# Patient Record
Sex: Male | Born: 1995 | Race: Black or African American | Hispanic: No | Marital: Single | Smoking: Never smoker
Health system: Southern US, Community
[De-identification: ages and names within clinical notes are randomized; demographics above are authoritative.]

## PROBLEM LIST (undated history)

## (undated) HISTORY — PX: TESTICLE SURGERY: SHX794

---

## 2013-04-17 ENCOUNTER — Emergency Department (HOSPITAL_COMMUNITY): Payer: No Typology Code available for payment source

## 2013-04-17 ENCOUNTER — Emergency Department (HOSPITAL_COMMUNITY)
Admission: EM | Admit: 2013-04-17 | Discharge: 2013-04-17 | Disposition: A | Discharge status: Home / Self Care | Payer: No Typology Code available for payment source | Attending: Emergency Medicine | Admitting: Emergency Medicine

## 2013-04-17 ENCOUNTER — Encounter (HOSPITAL_COMMUNITY): Payer: Self-pay | Admitting: Adult Health

## 2013-04-17 DIAGNOSIS — S0990XA Unspecified injury of head, initial encounter: Secondary | ICD-10-CM | POA: Insufficient documentation

## 2013-04-17 DIAGNOSIS — Y92410 Unspecified street and highway as the place of occurrence of the external cause: Secondary | ICD-10-CM | POA: Insufficient documentation

## 2013-04-17 DIAGNOSIS — Y9389 Activity, other specified: Secondary | ICD-10-CM | POA: Insufficient documentation

## 2013-04-17 DIAGNOSIS — R110 Nausea: Secondary | ICD-10-CM | POA: Insufficient documentation

## 2013-04-17 DIAGNOSIS — R42 Dizziness and giddiness: Secondary | ICD-10-CM | POA: Insufficient documentation

## 2013-04-17 MED ORDER — IBUPROFEN 800 MG PO TABS: 800.0000 mg | ORAL_TABLET | Freq: Three times a day (TID) | ORAL | Status: AC
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
## 2013-04-30 ENCOUNTER — Ambulatory Visit: Payer: Self-pay
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
## 2015-03-01 ENCOUNTER — Emergency Department (HOSPITAL_COMMUNITY): Payer: BLUE CROSS/BLUE SHIELD

## 2015-03-01 ENCOUNTER — Emergency Department (HOSPITAL_COMMUNITY)
Admission: EM | Admit: 2015-03-01 | Discharge: 2015-03-01 | Disposition: A | Discharge status: Home / Self Care | Payer: BLUE CROSS/BLUE SHIELD | Attending: Emergency Medicine | Admitting: Emergency Medicine

## 2015-03-01 ENCOUNTER — Encounter (HOSPITAL_COMMUNITY): Payer: Self-pay | Admitting: Family Medicine

## 2015-03-01 DIAGNOSIS — Z791 Long term (current) use of non-steroidal anti-inflammatories (NSAID): Secondary | ICD-10-CM | POA: Insufficient documentation

## 2015-03-01 DIAGNOSIS — N451 Epididymitis: Secondary | ICD-10-CM | POA: Diagnosis not present

## 2015-03-01 DIAGNOSIS — N508 Other specified disorders of male genital organs: Secondary | ICD-10-CM | POA: Diagnosis present

## 2015-03-01 DIAGNOSIS — N50819 Testicular pain, unspecified: Secondary | ICD-10-CM

## 2015-03-01 MED ORDER — DOXYCYCLINE HYCLATE 100 MG PO CAPS: 100.0000 mg | ORAL_CAPSULE | Freq: Two times a day (BID) | ORAL | Status: AC

## 2015-03-01 MED ORDER — DOXYCYCLINE HYCLATE 100 MG PO TABS
100.0000 mg | ORAL_TABLET | Freq: Once | ORAL | Status: AC
  Administered 2015-03-01: 100 mg via ORAL
  Filled 2015-03-01: qty 1

## 2015-03-01 MED ORDER — STERILE WATER FOR INJECTION IJ SOLN
INTRAMUSCULAR | Status: AC
  Filled 2015-03-01: qty 10

## 2015-03-01 MED ORDER — CEFTRIAXONE SODIUM 1 G IJ SOLR
1.0000 g | Freq: Once | INTRAMUSCULAR | Status: AC
  Administered 2015-03-01: 1 g via INTRAMUSCULAR
  Filled 2015-03-01: qty 10

## 2015-03-01 MED ORDER — HYDROCODONE-ACETAMINOPHEN 5-325 MG PO TABS
1.0000 | ORAL_TABLET | Freq: Once | ORAL | Status: AC
  Administered 2015-03-01: 1 via ORAL
  Filled 2015-03-01: qty 1

## 2015-03-01 NOTE — ED Notes (Addendum)
Pt states L sided testicular pain and swelling. Onset this morning. 5/10 pain. Denies abdominal pain. Denies urinary symptoms. Denies penile discharge.

## 2015-03-01 NOTE — ED Notes (Signed)
Pt sts he woke up this am with left testicle pain and slight swelling.

## 2015-03-01 NOTE — Discharge Instructions (Signed)
Epididymitis °Epididymitis is a swelling (inflammation) of the epididymis. The epididymis is a cord-like structure along the back part of the testicle. Epididymitis is usually, but not always, caused by infection. This is usually a sudden problem beginning with chills, fever and pain behind the scrotum and in the testicle. There may be swelling and redness of the testicle. °DIAGNOSIS  °Physical examination will reveal a tender, swollen epididymis. Sometimes, cultures are obtained from the urine or from prostate secretions to help find out if there is an infection or if the cause is a different problem. Sometimes, blood work is performed to see if your white blood cell count is elevated and if a germ (bacterial) or viral infection is present. Using this knowledge, an appropriate medicine which kills germs (antibiotic) can be chosen by your caregiver. A viral infection causing epididymitis will most often go away (resolve) without treatment. °HOME CARE INSTRUCTIONS  °· Hot sitz baths for 20 minutes, 4 times per day, may help relieve pain. °· Only take over-the-counter or prescription medicines for pain, discomfort or fever as directed by your caregiver. °· Take all medicines, including antibiotics, as directed. Take the antibiotics for the full prescribed length of time even if you are feeling better. °· It is very important to keep all follow-up appointments. °SEEK IMMEDIATE MEDICAL CARE IF:  °· You have a fever. °· You have pain not relieved with medicines. °· You have any worsening of your problems. °· Your pain seems to come and go. °· You develop pain, redness, and swelling in the scrotum and surrounding areas. °MAKE SURE YOU:  °· Understand these instructions. °· Will watch your condition. °· Will get help right away if you are not doing well or get worse. °Document Released: 07/21/2000 Document Revised: 10/16/2011 Document Reviewed: 06/10/2009 °[employer]® Patient Information ©2015 [employer]. This information  is not intended to replace advice given to you by your health care provider. Make sure you discuss any questions you have with your health care provider. ° °

## 2015-03-01 NOTE — ED Provider Notes (Signed)
CSN: [id number]     Arrival date & time 03/01/15  0714 History   First MD Initiated Contact with Patient 03/01/15 [phone]     Chief Complaint  Patient presents with  . Testicle Pain      HPI  Patient presents for valuation of left-sided testicular pain. He states he awakened this morning. Between 6 and 6:30 he was still laying in bed in a developed left testicle pain. Describes it as severe. Pain with movement. No dysuria no source bumps lesions. Feels like is less testicle is swollen. No discharge. No history of STDs. No history of testicular abnormalities.  History reviewed. No pertinent past medical history. History reviewed. No pertinent past surgical history. History reviewed. No pertinent family history. History  Substance Use Topics  . Smoking status: Never Smoker   . Smokeless tobacco: Not on file  . Alcohol Use: No    Review of Systems  Constitutional: Negative for fever, chills, diaphoresis, appetite change and fatigue.  HENT: Negative for mouth sores, sore throat and trouble swallowing.   Eyes: Negative for visual disturbance.  Respiratory: Negative for cough, chest tightness, shortness of breath and wheezing.   Cardiovascular: Negative for chest pain.  Gastrointestinal: Negative for nausea, vomiting, abdominal pain, diarrhea and abdominal distention.  Endocrine: Negative for polydipsia, polyphagia and polyuria.  Genitourinary: Positive for testicular pain. Negative for dysuria, frequency and hematuria.  Musculoskeletal: Negative for gait problem.  Skin: Negative for color change, pallor and rash.  Neurological: Negative for dizziness, syncope, light-headedness and headaches.  Hematological: Does not bruise/bleed easily.  Psychiatric/Behavioral: Negative for behavioral problems and confusion.      Allergies  Review of patient's allergies indicates no known allergies.  Home Medications   Prior to Admission medications   Medication Sig Start Date End Date Taking?  Authorizing Provider  ibuprofen (ADVIL,MOTRIN) 800 MG tablet Take 1 tablet (800 mg total) by mouth 3 (three) times daily. 04/17/13   Glynn Octave, MD   BP 122/70 mmHg  Pulse 56  Temp(Src) 98.8 F (37.1 C) (Oral)  Resp 20  Wt 190 lb (86.183 kg)  SpO2 98% Physical Exam  Constitutional: He is oriented to person, place, and time. He appears well-developed and well-nourished. No distress.  HENT:  Head: Normocephalic.  Eyes: Conjunctivae are normal. Pupils are equal, round, and reactive to light. No scleral icterus.  Neck: Normal range of motion. Neck supple. No thyromegaly present.  Cardiovascular: Normal rate and regular rhythm.  Exam reveals no gallop and no friction rub.   No murmur heard. Pulmonary/Chest: Effort normal and breath sounds normal. No respiratory distress. He has no wheezes. He has no rales.  Abdominal: Soft. Bowel sounds are normal. He exhibits no distension. There is no tenderness. There is no rebound.  Genitourinary:     Musculoskeletal: Normal range of motion.  Neurological: He is alert and oriented to person, place, and time.  Skin: Skin is warm and dry. No rash noted.  Psychiatric: He has a normal mood and affect. His behavior is normal.    ED Course  Procedures (including critical care time) Labs Review Labs Reviewed  URINALYSIS, ROUTINE W REFLEX MICROSCOPIC (NOT AT [facility])    Imaging Review US Scrotum  03/01/2015   CLINICAL DATA:  Onset of left-sided testicular pain and swelling this morning  EXAM: ULTRASOUND OF SCROTUM  TECHNIQUE: Complete ultrasound examination of the testicles, epididymis, and other scrotal structures was performed.  COMPARISON:  None.  FINDINGS: Right testicle  Measurements: 4.8 x 2.1 x 3 cm.  No mass or microlithiasis visualized.  Left testicle  Measurements: 4.5 x 2.2 x 2.8 cm. No mass or microlithiasis visualized.  Right epididymis: Normal in size and appearance. It measures 9.1 x 12.6 x 7.3 mm. Vascularity is normal.  Left epididymis:  The left epididymis is mildly enlarged and hyperemic. It measures 13.2 x 15.1 by 23.9 mm.  Hydrocele:  There small bilateral hydroceles.  Varicocele:  None visualized.  IMPRESSION: 1. Findings consistent with acute left epididymitis. 2. The testes are normal in vascularity and echotexture. 3. There small bilateral hydroceles.   Electronically Signed   By: David  [place] M.D.   On: 03/01/2015 08:45   [place] Art/ven Flow Abd Pelv Doppler  03/01/2015   CLINICAL DATA:  Onset of left-sided testicular pain and swelling this morning  EXAM: ULTRASOUND OF SCROTUM  TECHNIQUE: Complete ultrasound examination of the testicles, epididymis, and other scrotal structures was performed.  COMPARISON:  None.  FINDINGS: Right testicle  Measurements: 4.8 x 2.1 x 3 cm. No mass or microlithiasis visualized.  Left testicle  Measurements: 4.5 x 2.2 x 2.8 cm. No mass or microlithiasis visualized.  Right epididymis: Normal in size and appearance. It measures 9.1 x 12.6 x 7.3 mm. Vascularity is normal.  Left epididymis: The left epididymis is mildly enlarged and hyperemic. It measures 13.2 x 15.1 by 23.9 mm.  Hydrocele:  There small bilateral hydroceles.  Varicocele:  None visualized.  IMPRESSION: 1. Findings consistent with acute left epididymitis. 2. The testes are normal in vascularity and echotexture. 3. There small bilateral hydroceles.   Electronically Signed   By: David  [place] M.D.   On: 03/01/2015 08:45     EKG Interpretation None      MDM   Final diagnoses:  Testicle pain    No torsion on ultrasound. This shows epididymitis. Plan will be IV Rocephin here doxycycline.    Rolland Porter, MD 03/01/15 [phone]

## 2016-02-29 IMAGING — US US SCROTUM
1 series · 14 of 25 positions shown · non-contrast
Comparison: None.

CLINICAL DATA: Onset of left-sided testicular pain and swelling
this morning

EXAM:
ULTRASOUND OF SCROTUM
TECHNIQUE: Complete ultrasound examination of the testicles, epididymis, and
other scrotal structures was performed.

[Series 1: us scrotum · 0.06mm/px · 14 of 52 slices shown]
[im 1/52]
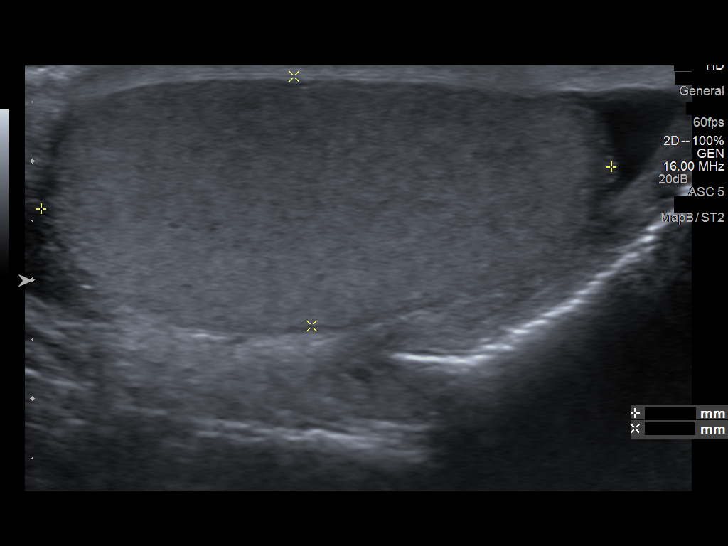
[im 5/52]
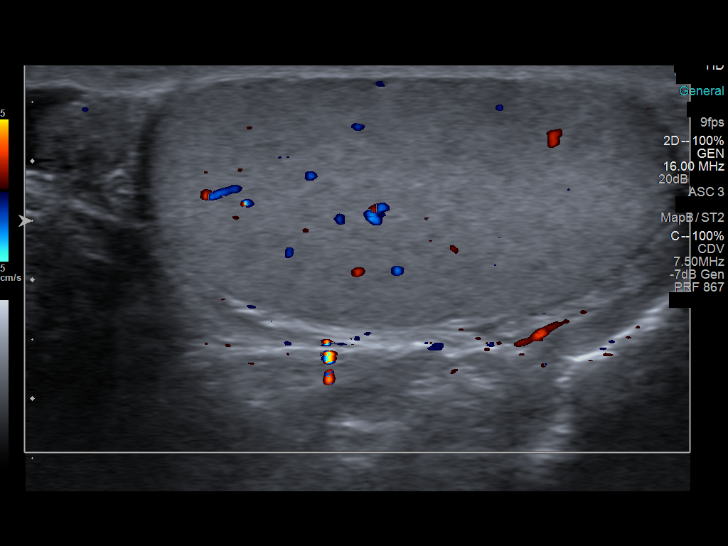
[im 9/52]
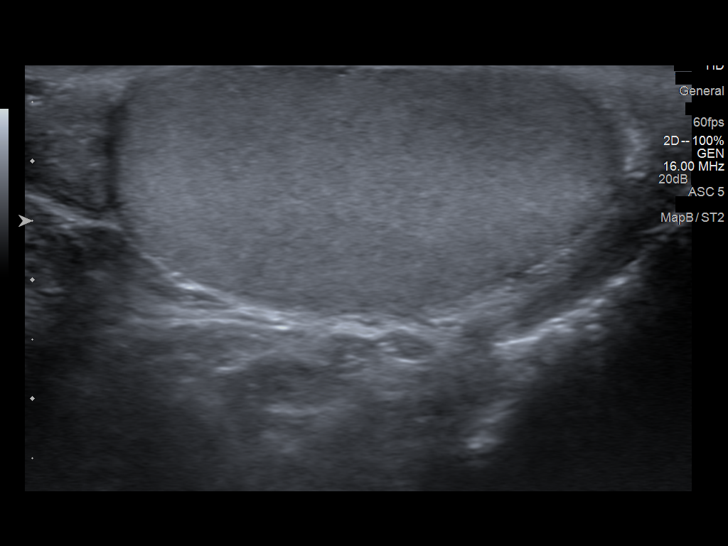
[im 13/52]
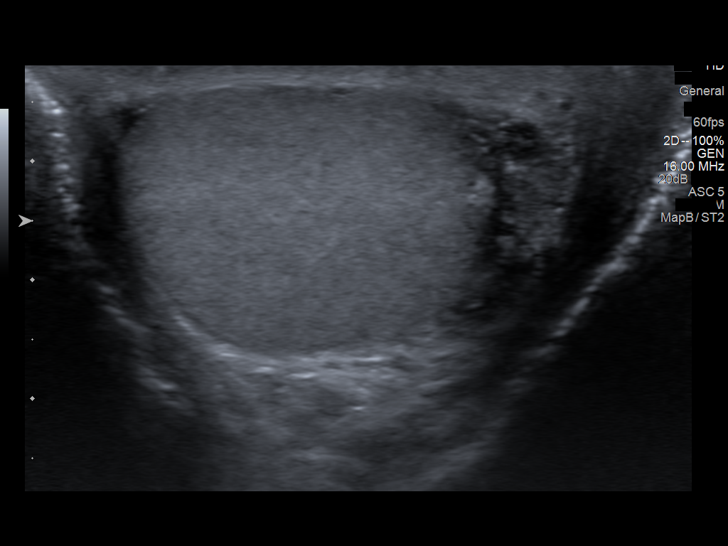
[im 18/52]
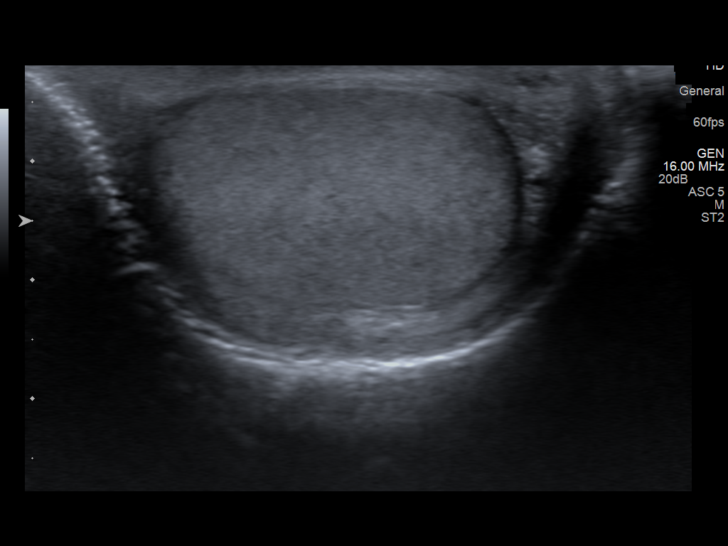
[im 20/52]
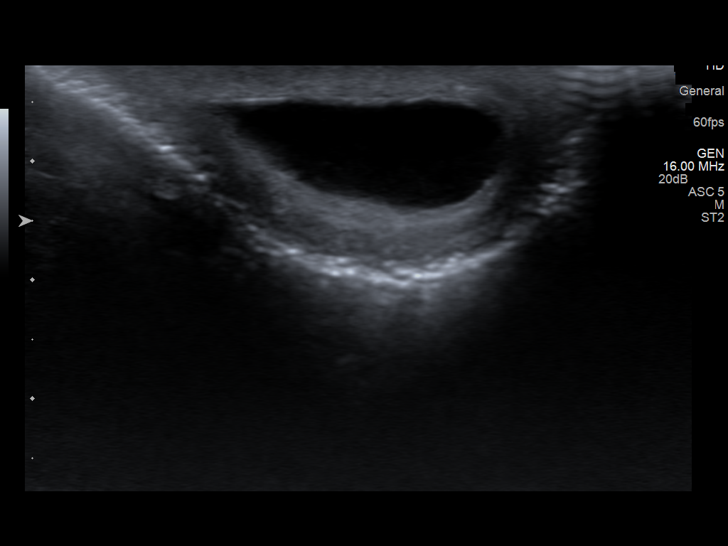
[im 24/52]
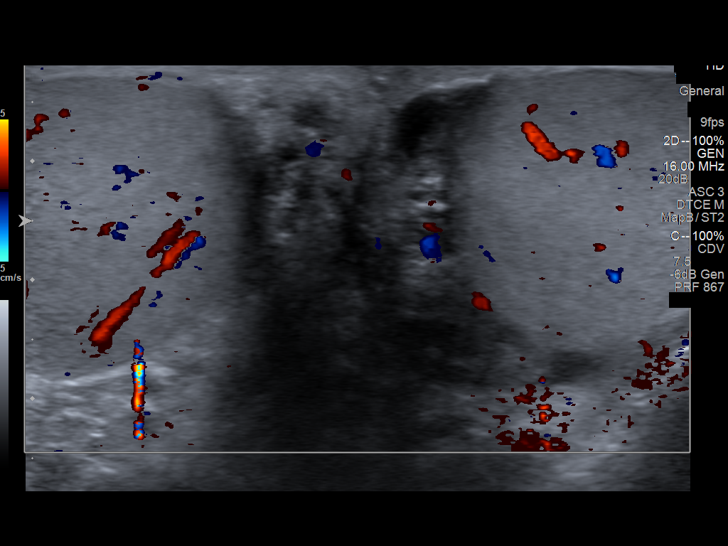
[im 28/52]
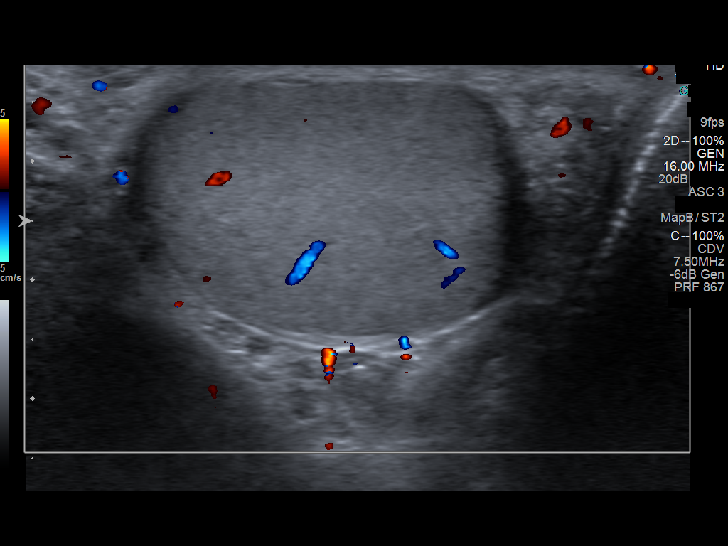
[im 32/52]
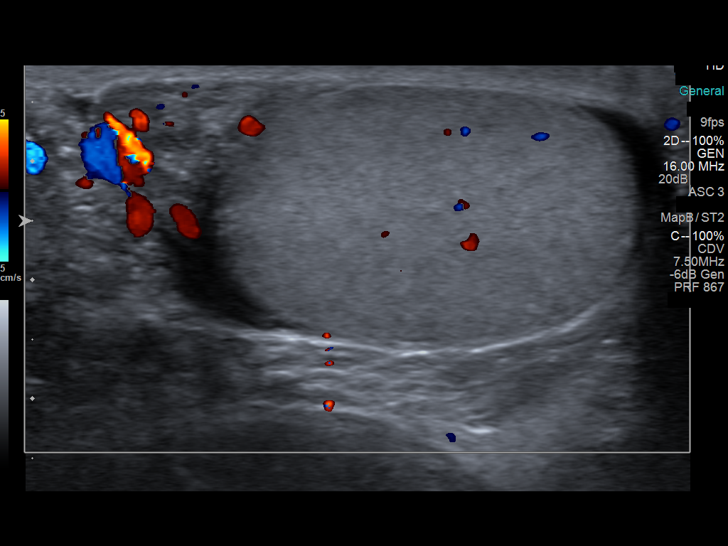
[im 35/52]
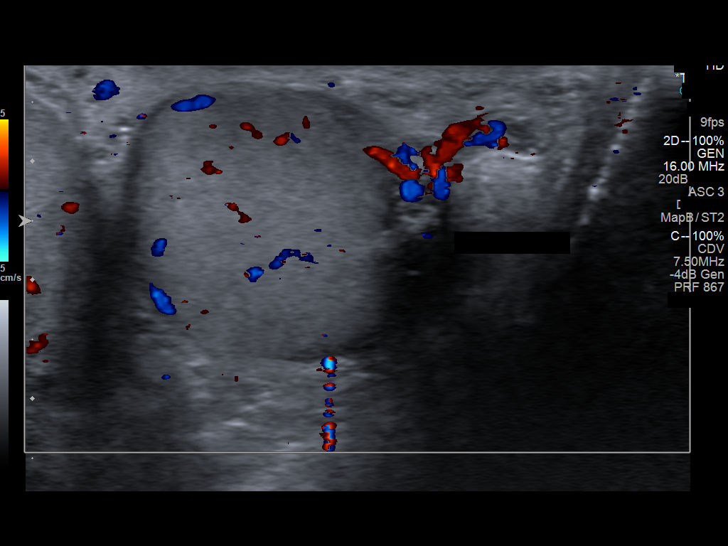
[im 39/52]
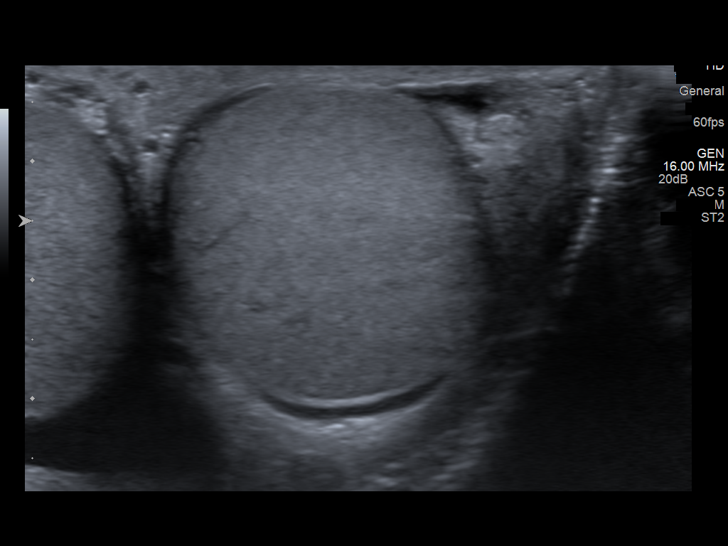
[im 43/52]
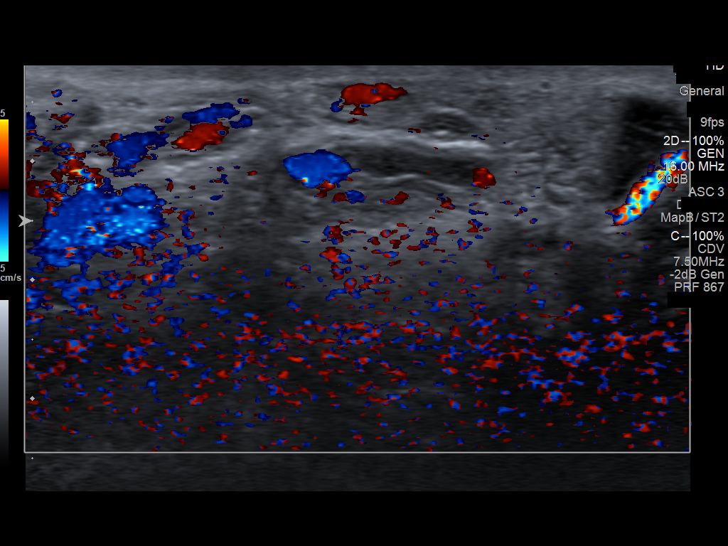
[im 47/52]
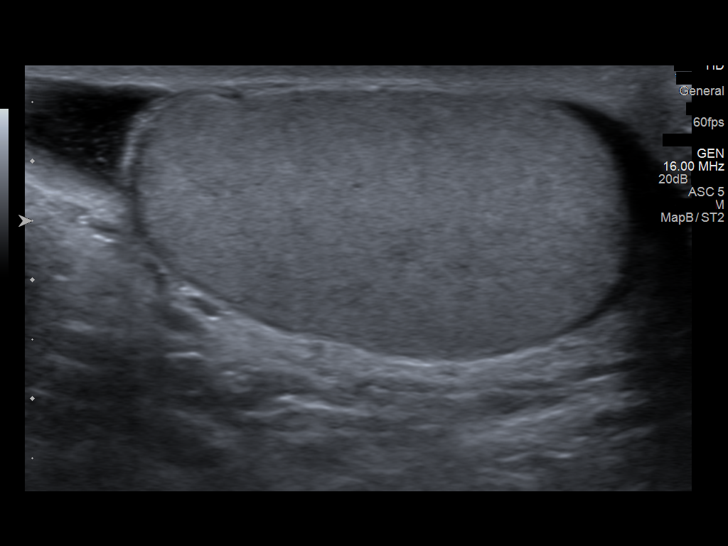
[im 52/52]
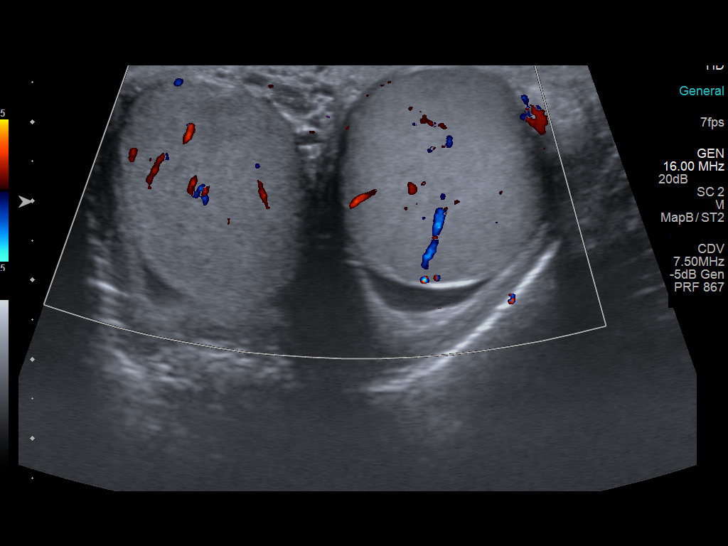

[14 of 25 positions shown; findings below may reference images not displayed]

FINDINGS: Right testicle

Measurements: 4.8 x 2.1 x 3 cm. No mass or microlithiasis
visualized.

Left testicle

Measurements: 4.5 x 2.2 x 2.8 cm. No mass or microlithiasis
visualized.

Right epididymis: Normal in size and appearance. It measures 9.1 x
12.6 x 7.3 mm. Vascularity is normal.

Left epididymis: The left epididymis is mildly enlarged and
hyperemic. It measures 13.2 x 15.1 by 23.9 mm.

Hydrocele:  There small bilateral hydroceles.

Varicocele:  None visualized.
IMPRESSION: 1. Findings consistent with acute left epididymitis.
2. The testes are normal in vascularity and echotexture.
3. There small bilateral hydroceles.

## 2016-03-22 ENCOUNTER — Encounter (HOSPITAL_COMMUNITY): Payer: Self-pay | Admitting: Emergency Medicine

## 2016-03-22 DIAGNOSIS — R1110 Vomiting, unspecified: Secondary | ICD-10-CM | POA: Diagnosis not present

## 2016-03-22 DIAGNOSIS — Z5321 Procedure and treatment not carried out due to patient leaving prior to being seen by health care provider: Secondary | ICD-10-CM | POA: Diagnosis not present

## 2016-03-22 DIAGNOSIS — J029 Acute pharyngitis, unspecified: Secondary | ICD-10-CM | POA: Insufficient documentation

## 2016-03-22 NOTE — ED Triage Notes (Signed)
Pt. reports sore throat with mild swelling onset yesterday , vomitted at triage while attempting to swab throat . Denies fever or chills.

## 2016-03-23 ENCOUNTER — Emergency Department (HOSPITAL_COMMUNITY)
Admission: EM | Admit: 2016-03-23 | Discharge: 2016-03-23 | Disposition: A | Discharge status: Home / Self Care | Payer: BLUE CROSS/BLUE SHIELD | Attending: Emergency Medicine | Admitting: Emergency Medicine

## 2016-03-23 LAB — CBC WITH DIFFERENTIAL/PLATELET
BASOS ABS: 0 10*3/uL (ref 0.0–0.1)
Basophils Relative: 0 %
EOS PCT: 0 %
Eosinophils Absolute: 0 10*3/uL (ref 0.0–0.7)
HEMATOCRIT: 44.2 % (ref 39.0–52.0)
HEMOGLOBIN: 14.4 g/dL (ref 13.0–17.0)
LYMPHS ABS: 1.4 10*3/uL (ref 0.7–4.0)
LYMPHS PCT: 8 %
MCH: 28.9 pg (ref 26.0–34.0)
MCHC: 32.6 g/dL (ref 30.0–36.0)
MCV: 88.8 fL (ref 78.0–100.0)
Monocytes Absolute: 0.9 10*3/uL (ref 0.1–1.0)
Monocytes Relative: 5 %
NEUTROS ABS: 15.7 10*3/uL — AB (ref 1.7–7.7)
Neutrophils Relative %: 87 %
PLATELETS: 269 10*3/uL (ref 150–400)
RBC: 4.98 MIL/uL (ref 4.22–5.81)
RDW: 12.2 % (ref 11.5–15.5)
WBC: 18.1 10*3/uL — AB (ref 4.0–10.5)

## 2016-03-23 LAB — COMPREHENSIVE METABOLIC PANEL
ALK PHOS: 95 U/L (ref 38–126)
ALT: 43 U/L (ref 17–63)
AST: 33 U/L (ref 15–41)
Albumin: 4.2 g/dL (ref 3.5–5.0)
Anion gap: 5 (ref 5–15)
BILIRUBIN TOTAL: 1 mg/dL (ref 0.3–1.2)
BUN: 10 mg/dL (ref 6–20)
CALCIUM: 9.5 mg/dL (ref 8.9–10.3)
CO2: 25 mmol/L (ref 22–32)
CREATININE: 1.28 mg/dL — AB (ref 0.61–1.24)
Chloride: 107 mmol/L (ref 101–111)
Glucose, Bld: 119 mg/dL — ABNORMAL HIGH (ref 65–99)
Potassium: 3.6 mmol/L (ref 3.5–5.1)
Sodium: 137 mmol/L (ref 135–145)
Total Protein: 7.5 g/dL (ref 6.5–8.1)

## 2016-03-23 LAB — URINALYSIS, ROUTINE W REFLEX MICROSCOPIC
BILIRUBIN URINE: NEGATIVE
GLUCOSE, UA: NEGATIVE mg/dL
HGB URINE DIPSTICK: NEGATIVE
Ketones, ur: NEGATIVE mg/dL
Leukocytes, UA: NEGATIVE
Nitrite: NEGATIVE
PROTEIN: NEGATIVE mg/dL
Specific Gravity, Urine: 1.027 (ref 1.005–1.030)
pH: 7.5 (ref 5.0–8.0)

## 2016-03-23 LAB — RAPID STREP SCREEN (MED CTR MEBANE ONLY): STREPTOCOCCUS, GROUP A SCREEN (DIRECT): POSITIVE — AB

## 2016-03-23 NOTE — ED Notes (Signed)
Called to take pt to room, no answer x's 4.

## 2020-07-24 ENCOUNTER — Emergency Department (HOSPITAL_BASED_OUTPATIENT_CLINIC_OR_DEPARTMENT_OTHER)
Admission: EM | Admit: 2020-07-24 | Discharge: 2020-07-24 | Disposition: A | Discharge status: Home / Self Care | Payer: BLUE CROSS/BLUE SHIELD | Attending: Emergency Medicine | Admitting: Emergency Medicine

## 2020-07-24 ENCOUNTER — Encounter (HOSPITAL_BASED_OUTPATIENT_CLINIC_OR_DEPARTMENT_OTHER): Payer: Self-pay | Admitting: Emergency Medicine

## 2020-07-24 DIAGNOSIS — M7989 Other specified soft tissue disorders: Secondary | ICD-10-CM | POA: Insufficient documentation

## 2020-07-24 DIAGNOSIS — M79645 Pain in left finger(s): Secondary | ICD-10-CM | POA: Insufficient documentation

## 2020-07-24 DIAGNOSIS — Z5321 Procedure and treatment not carried out due to patient leaving prior to being seen by health care provider: Secondary | ICD-10-CM | POA: Insufficient documentation

## 2020-07-24 NOTE — ED Triage Notes (Signed)
Pt sent from [facility] to eval L pinky finger pain and swelling x 1 week.
